# Patient Record
Sex: Male | Born: 1990 | Hispanic: No | Marital: Single | Smoking: Former smoker
Health system: Southern US, Community
[De-identification: ages and names within clinical notes are randomized; demographics above are authoritative.]

## PROBLEM LIST (undated history)

## (undated) HISTORY — PX: APPENDECTOMY: SHX54

---

## 2017-07-25 ENCOUNTER — Encounter (HOSPITAL_COMMUNITY): Payer: Self-pay | Admitting: Emergency Medicine

## 2017-07-25 ENCOUNTER — Ambulatory Visit (HOSPITAL_COMMUNITY)
Admission: EM | Admit: 2017-07-25 | Discharge: 2017-07-26 | Disposition: A | Discharge status: Home / Self Care | Payer: 59 | Attending: General Surgery | Admitting: General Surgery

## 2017-07-25 ENCOUNTER — Emergency Department (HOSPITAL_COMMUNITY): Payer: 59 | Admitting: Anesthesiology

## 2017-07-25 ENCOUNTER — Encounter (HOSPITAL_COMMUNITY)
Admission: EM | Disposition: A | Discharge status: Home / Self Care | Payer: Self-pay | Source: Home / Self Care | Attending: Emergency Medicine

## 2017-07-25 ENCOUNTER — Emergency Department (HOSPITAL_COMMUNITY): Payer: 59

## 2017-07-25 DIAGNOSIS — K3580 Unspecified acute appendicitis: Secondary | ICD-10-CM | POA: Diagnosis present

## 2017-07-25 DIAGNOSIS — K353 Acute appendicitis with localized peritonitis, without perforation or gangrene: Secondary | ICD-10-CM

## 2017-07-25 DIAGNOSIS — F17210 Nicotine dependence, cigarettes, uncomplicated: Secondary | ICD-10-CM | POA: Insufficient documentation

## 2017-07-25 HISTORY — PX: LAPAROSCOPIC APPENDECTOMY: SHX408

## 2017-07-25 LAB — CBC
HCT: 42.3 % (ref 39.0–52.0)
HEMATOCRIT: 44.5 % (ref 39.0–52.0)
HEMOGLOBIN: 15 g/dL (ref 13.0–17.0)
Hemoglobin: 14.3 g/dL (ref 13.0–17.0)
MCH: 27.9 pg (ref 26.0–34.0)
MCH: 28 pg (ref 26.0–34.0)
MCHC: 33.7 g/dL (ref 30.0–36.0)
MCHC: 33.8 g/dL (ref 30.0–36.0)
MCV: 82.7 fL (ref 78.0–100.0)
MCV: 82.8 fL (ref 78.0–100.0)
PLATELETS: 229 10*3/uL (ref 150–400)
Platelets: 233 10*3/uL (ref 150–400)
RBC: 5.38 MIL/uL (ref 4.22–5.81)
RBC: 5.11 MIL/uL (ref 4.22–5.81)
RDW: 12.5 % (ref 11.5–15.5)
RDW: 12.7 % (ref 11.5–15.5)
WBC: 7.6 10*3/uL (ref 4.0–10.5)
WBC: 7.7 10*3/uL (ref 4.0–10.5)

## 2017-07-25 LAB — COMPREHENSIVE METABOLIC PANEL
ALBUMIN: 4.3 g/dL (ref 3.5–5.0)
ALK PHOS: 69 U/L (ref 38–126)
ALT: 38 U/L (ref 17–63)
ANION GAP: 6 (ref 5–15)
AST: 25 U/L (ref 15–41)
BUN: 10 mg/dL (ref 6–20)
CO2: 28 mmol/L (ref 22–32)
Calcium: 9.3 mg/dL (ref 8.9–10.3)
Chloride: 104 mmol/L (ref 101–111)
Creatinine, Ser: 0.73 mg/dL (ref 0.61–1.24)
GFR calc Af Amer: >60 mL/min (ref >60)
GFR calc non Af Amer: >60 mL/min (ref >60)
GLUCOSE: 98 mg/dL (ref 65–99)
POTASSIUM: 3.8 mmol/L (ref 3.5–5.1)
SODIUM: 138 mmol/L (ref 135–145)
Total Bilirubin: 0.8 mg/dL (ref 0.3–1.2)
Total Protein: 6.8 g/dL (ref 6.5–8.1)

## 2017-07-25 LAB — URINALYSIS, ROUTINE W REFLEX MICROSCOPIC
BILIRUBIN URINE: NEGATIVE
GLUCOSE, UA: NEGATIVE mg/dL
HGB URINE DIPSTICK: NEGATIVE
Ketones, ur: NEGATIVE mg/dL
Leukocytes, UA: NEGATIVE
Nitrite: NEGATIVE
PH: 5 (ref 5.0–8.0)
PROTEIN: NEGATIVE mg/dL
SPECIFIC GRAVITY, URINE: 1.021 (ref 1.005–1.030)

## 2017-07-25 LAB — CREATININE, SERUM
CREATININE: 0.75 mg/dL (ref 0.61–1.24)
GFR calc Af Amer: >60 mL/min (ref >60)
GFR calc non Af Amer: >60 mL/min (ref >60)

## 2017-07-25 LAB — LIPASE, BLOOD: Lipase: 28 U/L (ref 11–51)

## 2017-07-25 SURGERY — APPENDECTOMY, LAPAROSCOPIC
Anesthesia: General | Site: Abdomen

## 2017-07-25 MED ORDER — FENTANYL CITRATE (PF) 100 MCG/2ML IJ SOLN
INTRAMUSCULAR | Status: DC | PRN
  Administered 2017-07-25: 50 ug via INTRAVENOUS
  Administered 2017-07-25 (×2): 100 ug via INTRAVENOUS

## 2017-07-25 MED ORDER — POTASSIUM CHLORIDE 2 MEQ/ML IV SOLN
INTRAVENOUS | Status: DC
  Administered 2017-07-25: 21:00:00 via INTRAVENOUS
  Filled 2017-07-25 (×4): qty 1000

## 2017-07-25 MED ORDER — BUPIVACAINE-EPINEPHRINE 0.25% -1:200000 IJ SOLN
INTRAMUSCULAR | Status: DC | PRN
  Administered 2017-07-25: 7 mL

## 2017-07-25 MED ORDER — DIPHENHYDRAMINE HCL 25 MG PO CAPS: 25.0000 mg | ORAL_CAPSULE | Freq: Four times a day (QID) | ORAL | Status: DC | PRN

## 2017-07-25 MED ORDER — ONDANSETRON 4 MG PO TBDP: 4.0000 mg | ORAL_TABLET | Freq: Four times a day (QID) | ORAL | Status: DC | PRN

## 2017-07-25 MED ORDER — PROPOFOL 10 MG/ML IV BOLUS
INTRAVENOUS | Status: DC | PRN
  Administered 2017-07-25: 200 mg via INTRAVENOUS

## 2017-07-25 MED ORDER — SUGAMMADEX SODIUM 200 MG/2ML IV SOLN
INTRAVENOUS | Status: DC | PRN
  Administered 2017-07-25: 200 mg via INTRAVENOUS

## 2017-07-25 MED ORDER — 0.9 % SODIUM CHLORIDE (POUR BTL) OPTIME
TOPICAL | Status: DC | PRN
  Administered 2017-07-25: 1000 mL

## 2017-07-25 MED ORDER — ENOXAPARIN SODIUM 40 MG/0.4ML ~~LOC~~ SOLN: 40.0000 mg | SUBCUTANEOUS | Status: DC

## 2017-07-25 MED ORDER — PROPOFOL 10 MG/ML IV BOLUS
INTRAVENOUS | Status: AC
  Filled 2017-07-25: qty 40

## 2017-07-25 MED ORDER — ACETAMINOPHEN 650 MG RE SUPP: 650.0000 mg | Freq: Four times a day (QID) | RECTAL | Status: DC | PRN

## 2017-07-25 MED ORDER — MIDAZOLAM HCL 2 MG/2ML IJ SOLN
INTRAMUSCULAR | Status: DC | PRN
  Administered 2017-07-25: 2 mg via INTRAVENOUS

## 2017-07-25 MED ORDER — BUPIVACAINE-EPINEPHRINE (PF) 0.25% -1:200000 IJ SOLN
INTRAMUSCULAR | Status: AC
Start: 2017-07-25 — End: 2017-07-25
  Filled 2017-07-25: qty 30

## 2017-07-25 MED ORDER — KETOROLAC TROMETHAMINE 30 MG/ML IJ SOLN: 30.0000 mg | Freq: Once | INTRAMUSCULAR | Status: DC | PRN

## 2017-07-25 MED ORDER — ACETAMINOPHEN 325 MG PO TABS: 650.0000 mg | ORAL_TABLET | Freq: Four times a day (QID) | ORAL | Status: DC | PRN

## 2017-07-25 MED ORDER — ONDANSETRON HCL 4 MG/2ML IJ SOLN
INTRAMUSCULAR | Status: AC
  Filled 2017-07-25: qty 2

## 2017-07-25 MED ORDER — PROMETHAZINE HCL 25 MG/ML IJ SOLN: 6.2500 mg | INTRAMUSCULAR | Status: DC | PRN

## 2017-07-25 MED ORDER — DEXAMETHASONE SODIUM PHOSPHATE 10 MG/ML IJ SOLN
INTRAMUSCULAR | Status: DC | PRN
  Administered 2017-07-25: 10 mg via INTRAVENOUS

## 2017-07-25 MED ORDER — DIPHENHYDRAMINE HCL 50 MG/ML IJ SOLN: 25.0000 mg | Freq: Four times a day (QID) | INTRAMUSCULAR | Status: DC | PRN

## 2017-07-25 MED ORDER — KETOROLAC TROMETHAMINE 30 MG/ML IJ SOLN
INTRAMUSCULAR | Status: DC | PRN
  Administered 2017-07-25: 30 mg via INTRAVENOUS

## 2017-07-25 MED ORDER — LIDOCAINE 2% (20 MG/ML) 5 ML SYRINGE
INTRAMUSCULAR | Status: AC
  Filled 2017-07-25: qty 5

## 2017-07-25 MED ORDER — ONDANSETRON HCL 4 MG/2ML IJ SOLN: 4.0000 mg | Freq: Four times a day (QID) | INTRAMUSCULAR | Status: DC | PRN

## 2017-07-25 MED ORDER — DEXTROSE 5 % IV SOLN
2.0000 g | INTRAVENOUS | Status: DC
  Filled 2017-07-25: qty 2

## 2017-07-25 MED ORDER — ONDANSETRON HCL 4 MG/2ML IJ SOLN
INTRAMUSCULAR | Status: DC | PRN
  Administered 2017-07-25: 4 mg via INTRAVENOUS

## 2017-07-25 MED ORDER — LACTATED RINGERS IV SOLN
INTRAVENOUS | Status: DC | PRN
  Administered 2017-07-25: 16:00:00 via INTRAVENOUS

## 2017-07-25 MED ORDER — HYDROCODONE-ACETAMINOPHEN 5-325 MG PO TABS
1.0000 | ORAL_TABLET | ORAL | Status: DC | PRN
  Administered 2017-07-26: 2 via ORAL
  Filled 2017-07-25: qty 2

## 2017-07-25 MED ORDER — METRONIDAZOLE IN NACL 5-0.79 MG/ML-% IV SOLN
500.0000 mg | Freq: Once | INTRAVENOUS | Status: AC
  Administered 2017-07-25: 500 mg via INTRAVENOUS
  Filled 2017-07-25: qty 100

## 2017-07-25 MED ORDER — HYDROMORPHONE HCL 1 MG/ML IJ SOLN
0.5000 mg | INTRAMUSCULAR | Status: DC | PRN
  Administered 2017-07-25: 1 mg via INTRAVENOUS
  Filled 2017-07-25: qty 1

## 2017-07-25 MED ORDER — ROCURONIUM BROMIDE 100 MG/10ML IV SOLN
INTRAVENOUS | Status: DC | PRN
  Administered 2017-07-25: 60 mg via INTRAVENOUS
  Administered 2017-07-25: 20 mg via INTRAVENOUS

## 2017-07-25 MED ORDER — HYDROMORPHONE HCL 1 MG/ML IJ SOLN
INTRAMUSCULAR | Status: AC
  Administered 2017-07-25: 0.5 mg via INTRAVENOUS
  Filled 2017-07-25: qty 1

## 2017-07-25 MED ORDER — ROCURONIUM BROMIDE 10 MG/ML (PF) SYRINGE
PREFILLED_SYRINGE | INTRAVENOUS | Status: AC
  Filled 2017-07-25: qty 5

## 2017-07-25 MED ORDER — METRONIDAZOLE IN NACL 5-0.79 MG/ML-% IV SOLN
500.0000 mg | Freq: Three times a day (TID) | INTRAVENOUS | Status: DC
  Administered 2017-07-26 (×2): 500 mg via INTRAVENOUS
  Filled 2017-07-25 (×3): qty 100

## 2017-07-25 MED ORDER — DEXAMETHASONE SODIUM PHOSPHATE 10 MG/ML IJ SOLN
INTRAMUSCULAR | Status: AC
  Filled 2017-07-25: qty 1

## 2017-07-25 MED ORDER — POTASSIUM CHLORIDE IN NACL 20-0.45 MEQ/L-% IV SOLN: INTRAVENOUS | Status: DC

## 2017-07-25 MED ORDER — FENTANYL CITRATE (PF) 250 MCG/5ML IJ SOLN
INTRAMUSCULAR | Status: AC
  Filled 2017-07-25: qty 5

## 2017-07-25 MED ORDER — LIDOCAINE 2% (20 MG/ML) 5 ML SYRINGE
INTRAMUSCULAR | Status: DC | PRN
  Administered 2017-07-25: 60 mg via INTRAVENOUS

## 2017-07-25 MED ORDER — MIDAZOLAM HCL 2 MG/2ML IJ SOLN
INTRAMUSCULAR | Status: AC
  Filled 2017-07-25: qty 2

## 2017-07-25 MED ORDER — SODIUM CHLORIDE 0.9 % IR SOLN
Status: DC | PRN
  Administered 2017-07-25: 1000 mL

## 2017-07-25 MED ORDER — DEXTROSE 5 % IV SOLN
2.0000 g | Freq: Once | INTRAVENOUS | Status: AC
  Administered 2017-07-25: 2 g via INTRAVENOUS
  Filled 2017-07-25: qty 2

## 2017-07-25 MED ORDER — KETOROLAC TROMETHAMINE 30 MG/ML IJ SOLN
INTRAMUSCULAR | Status: AC
  Filled 2017-07-25: qty 1

## 2017-07-25 MED ORDER — SUGAMMADEX SODIUM 200 MG/2ML IV SOLN
INTRAVENOUS | Status: AC
  Filled 2017-07-25: qty 2

## 2017-07-25 MED ORDER — HYDROMORPHONE HCL 1 MG/ML IJ SOLN
0.2500 mg | INTRAMUSCULAR | Status: DC | PRN
  Administered 2017-07-25 (×2): 0.5 mg via INTRAVENOUS

## 2017-07-25 SURGICAL SUPPLY — 34 items
APPLIER CLIP ROT 10 11.4 M/L (STAPLE)
BLADE CLIPPER SURG (BLADE) ×3 IMPLANT
CANISTER SUCT 3000ML PPV (MISCELLANEOUS) ×3 IMPLANT
CHLORAPREP W/TINT 26ML (MISCELLANEOUS) ×3 IMPLANT
CLIP APPLIE ROT 10 11.4 M/L (STAPLE) IMPLANT
COVER SURGICAL LIGHT HANDLE (MISCELLANEOUS) ×3 IMPLANT
CUTTER FLEX LINEAR 45M (STAPLE) ×3 IMPLANT
DERMABOND ADVANCED (GAUZE/BANDAGES/DRESSINGS) ×2
DERMABOND ADVANCED .7 DNX12 (GAUZE/BANDAGES/DRESSINGS) ×1 IMPLANT
ELECT REM PT RETURN 9FT ADLT (ELECTROSURGICAL) ×3
ELECTRODE REM PT RTRN 9FT ADLT (ELECTROSURGICAL) ×1 IMPLANT
GLOVE EUDERMIC 7 POWDERFREE (GLOVE) ×3 IMPLANT
GOWN STRL REUS W/ TWL LRG LVL3 (GOWN DISPOSABLE) ×2 IMPLANT
GOWN STRL REUS W/ TWL XL LVL3 (GOWN DISPOSABLE) ×1 IMPLANT
GOWN STRL REUS W/TWL LRG LVL3 (GOWN DISPOSABLE) ×4
GOWN STRL REUS W/TWL XL LVL3 (GOWN DISPOSABLE) ×2
KIT BASIN OR (CUSTOM PROCEDURE TRAY) ×3 IMPLANT
KIT ROOM TURNOVER OR (KITS) ×3 IMPLANT
NS IRRIG 1000ML POUR BTL (IV SOLUTION) ×3 IMPLANT
PAD ARMBOARD 7.5X6 YLW CONV (MISCELLANEOUS) ×6 IMPLANT
POUCH SPECIMEN RETRIEVAL 10MM (ENDOMECHANICALS) ×3 IMPLANT
RELOAD STAPLE TA45 3.5 REG BLU (ENDOMECHANICALS) ×3 IMPLANT
SET IRRIG TUBING LAPAROSCOPIC (IRRIGATION / IRRIGATOR) ×3 IMPLANT
SHEARS HARMONIC ACE PLUS 36CM (ENDOMECHANICALS) ×3 IMPLANT
SPECIMEN JAR SMALL (MISCELLANEOUS) ×3 IMPLANT
SUT MNCRL AB 4-0 PS2 18 (SUTURE) ×3 IMPLANT
TOWEL OR 17X24 6PK STRL BLUE (TOWEL DISPOSABLE) ×3 IMPLANT
TOWEL OR 17X26 10 PK STRL BLUE (TOWEL DISPOSABLE) ×3 IMPLANT
TRAY FOLEY CATH SILVER 16FR (SET/KITS/TRAYS/PACK) ×3 IMPLANT
TRAY LAPAROSCOPIC MC (CUSTOM PROCEDURE TRAY) ×3 IMPLANT
TROCAR XCEL 12X100 BLDLESS (ENDOMECHANICALS) ×3 IMPLANT
TROCAR XCEL BLUNT TIP 100MML (ENDOMECHANICALS) ×3 IMPLANT
TROCAR XCEL NON-BLD 5MMX100MML (ENDOMECHANICALS) ×3 IMPLANT
TUBING INSUFFLATION (TUBING) ×3 IMPLANT

## 2017-07-25 NOTE — ED Triage Notes (Signed)
Pt to ER for evaluation of RLQ abd pain that began suddenly and severely this morning at 4:30am, was seen at [facility] and sent here for appendicitis workup. Pt states diarrhea onset this morning as well. Pt is a/o x4.

## 2017-07-25 NOTE — H&P (Signed)
[facility] Admission Note  Chadd Tollison [facility] 03-Oct-1991  [id number].    Requesting MD: Regenia Skeeter, MD Chief Complaint/Reason for Consult: acute appendicitis  HPI:  Mr. Edward Melton is a 26 y.o. Otherwise healthy male with a cc of abdominal pain that started at 0430. Patient reports waking up at this time with generalized abdominal pain and diarrhea. Pain localized to RLQ and remained sharp, constant, and non-radiating so the patient presented to urgent care for evaluation before coming to the ED for a CT scan. He also endorses nausea. He denies similar pain in the past. Denies a history of abdominal surgery, GERD, GI issues, urinary symptoms, sick contacts, recent travel, or recent illness. He smokes 5 cigarettes daily and denies alcohol or illicit drug use. NKDA. Employed as a Barrister's clerk. Last drank water around 0730 this AM and has had no food today.  CT scan performed in ED significant for a dilated appendix (11m) and mild periappendiceal inflammation/edema without perforation or abscess. Lab work unremarkable.   ROS: Review of Systems  Gastrointestinal: Positive for abdominal pain, diarrhea and nausea. Negative for blood in stool.  All other systems reviewed and are negative.   History reviewed. No pertinent family history.  History reviewed. No pertinent past medical history.  History reviewed. No pertinent surgical history.  Social History:  reports that he has been smoking Cigarettes.  He has been smoking about 0.25 packs per day. He has never used smokeless tobacco. His alcohol and drug histories are not on file.  Allergies: Not on File   (Not in a hospital admission)  Blood pressure 110/82, pulse 72, temperature 98.9 F (37.2 C), temperature source Oral, resp. rate 16, SpO2 100 %. Physical Exam: Physical Exam  Constitutional: He is oriented to person, place, and time. He appears well-developed and well-nourished. No distress.  HENT:  Head: Normocephalic and  atraumatic.  Right Ear: External ear normal.  Left Ear: External ear normal.  Nose: Nose normal.  Eyes: Pupils are equal, round, and reactive to light. EOM are normal. Right eye exhibits no discharge. Left eye exhibits no discharge. No scleral icterus.  Neck: Normal range of motion. Neck supple. No tracheal deviation present. No thyromegaly present.  Cardiovascular: Normal rate, regular rhythm, normal heart sounds and intact distal pulses.   Pulmonary/Chest: Effort normal and breath sounds normal. No stridor. No respiratory distress. He has no wheezes. He has no rales.  Abdominal: Soft. Bowel sounds are normal. He exhibits no distension and no mass. There is tenderness. There is no rebound and no guarding. No hernia.  TTP RLQ without peritoneal signs.  Musculoskeletal: Normal range of motion. He exhibits no edema or deformity.  Neurological: He is alert and oriented to person, place, and time. No sensory deficit.  Skin: Skin is warm and dry. No rash noted. He is not diaphoretic.  Psychiatric: He has a normal mood and affect. His behavior is normal.    Results for orders placed or performed during the hospital encounter of 07/25/17 (from the past 48 hour(s))  Lipase, blood     Status: None   Collection Time: 07/25/17  9:44 AM  Result Value Ref Range   Lipase 28 11 - 51 U/L  Comprehensive metabolic panel     Status: None   Collection Time: 07/25/17  9:44 AM  Result Value Ref Range   Sodium 138 135 - 145 mmol/L   Potassium 3.8 3.5 - 5.1 mmol/L   Chloride 104 101 - 111 mmol/L   CO2 28 22 -  32 mmol/L   Glucose, Bld 98 65 - 99 mg/dL   BUN 10 6 - 20 mg/dL   Creatinine, Ser 0.73 0.61 - 1.24 mg/dL   Calcium 9.3 8.9 - 10.3 mg/dL   Total Protein 6.8 6.5 - 8.1 g/dL   Albumin 4.3 3.5 - 5.0 g/dL   AST 25 15 - 41 U/L   ALT 38 17 - 63 U/L   Alkaline Phosphatase 69 38 - 126 U/L   Total Bilirubin 0.8 0.3 - 1.2 mg/dL   GFR calc non Af Amer >60 >60 mL/min   GFR calc Af Amer >60 >60 mL/min     Comment: (NOTE) The eGFR has been calculated using the CKD EPI equation. This calculation has not been validated in all clinical situations. eGFR's persistently <60 mL/min signify possible Chronic Kidney Disease.    Anion gap 6 5 - 15  CBC     Status: None   Collection Time: 07/25/17  9:44 AM  Result Value Ref Range   WBC 7.6 4.0 - 10.5 K/uL   RBC 5.38 4.22 - 5.81 MIL/uL   Hemoglobin 15.0 13.0 - 17.0 g/dL   HCT 44.5 39.0 - 52.0 %   MCV 82.7 78.0 - 100.0 fL   MCH 27.9 26.0 - 34.0 pg   MCHC 33.7 30.0 - 36.0 g/dL   RDW 12.5 11.5 - 15.5 %   Platelets 233 150 - 400 K/uL  Urinalysis, Routine w reflex microscopic     Status: None   Collection Time: 07/25/17  9:58 AM  Result Value Ref Range   Color, Urine YELLOW YELLOW   APPearance CLEAR CLEAR   Specific Gravity, Urine 1.021 1.005 - 1.030   pH 5.0 5.0 - 8.0   Glucose, UA NEGATIVE NEGATIVE mg/dL   Hgb urine dipstick NEGATIVE NEGATIVE   Bilirubin Urine NEGATIVE NEGATIVE   Ketones, ur NEGATIVE NEGATIVE mg/dL   Protein, ur NEGATIVE NEGATIVE mg/dL   Nitrite NEGATIVE NEGATIVE   Leukocytes, UA NEGATIVE NEGATIVE   Ct Renal Stone Study  Result Date: 07/25/2017 CLINICAL DATA:  Right-sided abdominal pain and nausea. EXAM: CT ABDOMEN AND PELVIS WITHOUT CONTRAST TECHNIQUE: Multidetector CT imaging of the abdomen and pelvis was performed following the standard protocol without IV contrast. COMPARISON:  None. FINDINGS: Lower chest:  Unremarkable. Hepatobiliary: The liver shows diffusely decreased attenuation suggesting steatosis. There is no evidence for gallstones, gallbladder wall thickening, or pericholecystic fluid. No intrahepatic or extrahepatic biliary dilation. Pancreas: No focal mass lesion. No dilatation of the main duct. No intraparenchymal cyst. No peripancreatic edema. Spleen: No splenomegaly. No focal mass lesion. Adrenals/Urinary Tract: No adrenal nodule or mass. No stones in either kidney or ureter. No secondary changes in either kidney  or ureter. The urinary bladder appears normal for the degree of distention. Stomach/Bowel: The heart size is normal. Stomach is nondistended. No gastric wall thickening. No evidence of outlet obstruction. Duodenum is normally positioned as is the ligament of Treitz. No small bowel wall thickening. No small bowel dilatation. The terminal ileum is normal. The appendix is dilated up to 8 mm diameter and there is periappendiceal edema/ inflammation. No gross colonic mass. No colonic wall thickening. No substantial diverticular change. Vascular/Lymphatic: No abdominal aortic aneurysm. There is no gastrohepatic or hepatoduodenal ligament lymphadenopathy. No intraperitoneal or retroperitoneal lymphadenopathy. No pelvic sidewall lymphadenopathy. Reproductive: The prostate gland and seminal vesicles have normal imaging features. Other: Trace free fluid is evident in the pelvis. Musculoskeletal: Bone windows reveal no worrisome lytic or sclerotic osseous lesions. IMPRESSION: 1. Mild  appendiceal dilatation with mild periappendiceal edema/inflammation. Imaging features are consistent with acute appendicitis. No evidence for perforation or abscess by CT. I called these findings directly to Dr. Regenia Skeeter at 1433 hours on 07/25/2017. Electronically Signed   By: Misty Stanley M.D.   On: 07/25/2017 14:33   Assessment/Plan Acute appendicitis   Admit to [facility] service  NPO, IVF  IV Rocephin/flagyl  OR today for laparoscopic appendectomy  PRN analgesics and antiemetics    Jill Alexanders, [facility] Surgery 07/25/2017, 3:12 PM Pager: [phone] Consults: [phone] Mon-Fri 7:00 am-4:30 pm Sat-Sun 7:00 am-11:30 am

## 2017-07-25 NOTE — Anesthesia Procedure Notes (Addendum)
Procedure Name: Intubation Date/Time: 07/25/2017 4:15 PM Performed by: Trixie Deis A Pre-anesthesia Checklist: Patient identified, Emergency Drugs available, Suction available and Patient being monitored Patient Re-evaluated:Patient Re-evaluated prior to induction Oxygen Delivery Method: Circle System Utilized Preoxygenation: Pre-oxygenation with 100% oxygen Induction Type: IV induction Ventilation: Mask ventilation without difficulty Laryngoscope Size: Mac and 4 Grade View: Grade I Tube type: Oral Tube size: 7.5 mm Number of attempts: 1 Airway Equipment and Method: Stylet and Oral airway Placement Confirmation: ETT inserted through vocal cords under direct vision,  positive ETCO2 and breath sounds checked- equal and bilateral Secured at: 23 cm Tube secured with: Tape Dental Injury: Teeth and Oropharynx as per pre-operative assessment

## 2017-07-25 NOTE — Transfer of Care (Signed)
Immediate Anesthesia Transfer of Care Note  Patient: Edward Melton  Procedure(s) Performed: Procedure(s): APPENDECTOMY LAPAROSCOPIC (N/A)  Patient Location: PACU  Anesthesia Type:General  Level of Consciousness: awake, alert  and oriented  Airway & Oxygen Therapy: Patient Spontanous Breathing and Patient connected to nasal cannula oxygen  Post-op Assessment: Report given to RN, Post -op Vital signs reviewed and stable and Patient moving all extremities  Post vital signs: Reviewed and stable  Last Vitals:  Vitals:   07/25/17 1204 07/25/17 1723  BP: 110/82 (P) 119/80  Pulse: 72 (P) 76  Resp: 16 (P) 10  Temp: 37.2 C 36.9 C  SpO2: 100% (P) 100%    Last Pain:  Vitals:   07/25/17 1204  TempSrc: Oral  PainSc:          Complications: No apparent anesthesia complications

## 2017-07-25 NOTE — Op Note (Signed)
Patient Name:           Edward Melton   Date of Surgery:        07/25/2017  Pre op Diagnosis:      Acute appendicitis  Post op Diagnosis:    Acute suppurative appendicitis  Procedure:                 Laparoscopic appendectomy  Surgeon:                     Angelia Mould. Derrell Lolling, M.D., FACS  Assistant:                      OR staff   Indication for Assistant: N/A  Operative Indications:   This is a 26 year old man who states he has had abdominal pain since 4:30 this morning, a proximally 12 hours in duration.  No prior similar history.  He had some diarrhea.  The pain localized to the right lower quadrant.  He was nauseated.  No prior abdominal surgery.  Lab work and urinalysis is normal but his CT scan shows acute appendicitis with thickening and inflammatory changes around the appendix.  There is no abscess or evidence of perforation.  He was given IV fluids, IV antibiotics and brought to the operating room following counseling  Operative Findings:       The appendix was acutely inflamed, thickened, and had exudate on it.  There was no evidence of gangrene or perforation.  Terminal ileum and cecum, liver, rest of the colon viscera look normal.  Procedure in Detail:          Following the induction of general endotracheal anesthesia a Foley catheter was inserted, the abdomen was prepped and draped in a sterile fashion, surgical timeout was performed.  He was up-to-date on his antibiotics.  0.5% Marcaine with epinephrine was used as a local infiltration anesthetic.     A vertical incision was made at the upper rim of the umbilicus.  The fascia was incised in the midline and the abdominal cavity entered under direct vision.  An 11 mm Hassan trocar was inserted and secured with the Purstring suture of 0 Vicryl.  Pneumoperitoneum was created.  Video camera was inserted.  A 5 mm trocar was placed in the midline below the umbilicus and a 12 mm trocar placed in the suprapubic area under direct vision.   Four-quadrant inspection revealed no bleeding or intestinal injury.  After positioning the patient we were able to identify an  inflamed appendix, terminal ileum, ileocecal valve and cecum.  Using the harmonic scalpel I divided some of the peritoneal attachments to rotate the cecum medially.  I then divided the appendiceal mesentery and appendiceal artery with the harmonic scalpel with excellent hemostasis.  An Endo GIA was with a blue 2.5 mm load was placed across the appendix at the base of the cecum.  The stapler was closed, held in place for 20 seconds, fired,  and removed.  The appendix was  placed in a specimen bag and removed.  A couple of loose staples were removed.  The staple line was inspected.  Looked very good and there was no bleeding.  We irrigated the area.  We inspected the area of dissection there is no sign of any injury to adjacent organs.     The trochars were removed under direct vision.  The pneumoperitoneum was released.  The fascia at the umbilicus was closed with 0 Vicryl sutures.  After irrigating the trocar wounds were closed the skin with subcuticular 4-0 Monocryl and Dermabond.  The patient tolerated procedure well was taken to PACU in stable condition.  EBL 15-20 mL.  Counts correct.  Complications none.     Angelia Mould. Derrell Lolling, M.D., FACS General and Minimally Invasive Surgery Breast and Colorectal Surgery  07/25/2017 5:11 PM

## 2017-07-25 NOTE — Anesthesia Preprocedure Evaluation (Signed)
Anesthesia Evaluation  Patient identified by MRN, date of birth, ID band Patient awake    Reviewed: Allergy & Precautions, NPO status , Patient's Chart, lab work & pertinent test results  Airway Mallampati: II  TM Distance: >3 FB Neck ROM: Full    Dental no notable dental hx.    Pulmonary Current Smoker,    Pulmonary exam normal breath sounds clear to auscultation       Cardiovascular negative cardio ROS Normal cardiovascular exam Rhythm:Regular Rate:Normal     Neuro/Psych negative neurological ROS  negative psych ROS   GI/Hepatic negative GI ROS, Neg liver ROS,   Endo/Other  negative endocrine ROS  Renal/GU negative Renal ROS  negative genitourinary   Musculoskeletal negative musculoskeletal ROS (+)   Abdominal   Peds negative pediatric ROS (+)  Hematology negative hematology ROS (+)   Anesthesia Other Findings   Reproductive/Obstetrics negative OB ROS                             Anesthesia Physical Anesthesia Plan  ASA: II  Anesthesia Plan: General   Post-op Pain Management:    Induction: Intravenous  PONV Risk Score and Plan: 1 and Ondansetron and Dexamethasone  Airway Management Planned: Oral ETT  Additional Equipment:   Intra-op Plan:   Post-operative Plan: Extubation in OR  Informed Consent: I have reviewed the patients History and Physical, chart, labs and discussed the procedure including the risks, benefits and alternatives for the proposed anesthesia with the patient or authorized representative who has indicated his/her understanding and acceptance.   Dental advisory given  Plan Discussed with: CRNA and Surgeon  Anesthesia Plan Comments:         Anesthesia Quick Evaluation

## 2017-07-25 NOTE — Anesthesia Postprocedure Evaluation (Signed)
Anesthesia Post Note  Patient: Edward Melton  Procedure(s) Performed: Procedure(s) (LRB): APPENDECTOMY LAPAROSCOPIC (N/A)     Patient location during evaluation: PACU Anesthesia Type: General Level of consciousness: awake and alert Pain management: pain level controlled Vital Signs Assessment: post-procedure vital signs reviewed and stable Respiratory status: spontaneous breathing, nonlabored ventilation, respiratory function stable and patient connected to nasal cannula oxygen Cardiovascular status: blood pressure returned to baseline and stable Postop Assessment: no apparent nausea or vomiting Anesthetic complications: no    Last Vitals:  Vitals:   07/25/17 1723 07/25/17 1730  BP: 119/80 113/75  Pulse: 76 69  Resp: 10 (!) 23  Temp: 36.9 C   SpO2: 100% 99%    Last Pain:  Vitals:   07/25/17 1723  TempSrc:   PainSc: 0-No pain                 Ernestyne Caldwell S

## 2017-07-25 NOTE — Interval H&P Note (Signed)
History and Physical Interval Note:  07/25/2017 3:31 PM  Edward Melton Edward Melton  has presented today for surgery, with the diagnosis of APPENDICITIS  The various methods of treatment have been discussed with the patient and family. After consideration of risks, benefits and other options for treatment, the patient has consented to  Procedure(s): APPENDECTOMY LAPAROSCOPIC (N/A) as a surgical intervention .  The patient's history has been reviewed, patient examined, no change in status, stable for surgery.  I have reviewed the patient's chart and labs.  Questions were answered to the patient's satisfaction.     Ernestene Mention

## 2017-07-25 NOTE — ED Provider Notes (Signed)
[facility]-EMERGENCY DEPT Provider Note   CSN: [id number] Arrival date & time: 07/25/17  4098     History   Chief Complaint Chief Complaint  Patient presents with  . Abdominal Cramping    HPI Arlington Sigmund is a 26 y.o. male.  HPI  26 year old male presents with right lower quadrant abdominal pain. He states that this morning at 4:30 AM he awoke suddenly with diffuse abdominal pain. He states that he immediately had to go the bathroom and had diarrhea. He's had 4 or 5 episodes of watery diarrhea. For a couple hours the pain was generalized but now seems to be only in his right lower abdomen. He went to urgent care was told to come here to rule out appendicitis. He has not had any fevers but has had a little bit of nausea without vomiting. The pain initially was about 8/10 but now is about a 5/10. No urinary symptoms. A little bit of low back pain. He also endorses that he helped his brother move yesterday but does not remember injuring himself while lifting. He also had a milkshake last night around 8 PM and states he felt a little funny for about 30 minute afterwards but no other specific complaints. Pain worsens some with movement.  History reviewed. No pertinent past medical history.  There are no active problems to display for this patient.   History reviewed. No pertinent surgical history.     Home Medications    Prior to Admission medications   Not on File    Family History History reviewed. No pertinent family history.  Social History Social History  Substance Use Topics  . Smoking status: Current Every Day Smoker    Packs/day: 0.25    Types: Cigarettes  . Smokeless tobacco: Never Used  . Alcohol use Not on file     Allergies   Patient has no allergy information on record.   Review of Systems Review of Systems  Constitutional: Negative for fever.  Gastrointestinal: Positive for abdominal pain, diarrhea and nausea. Negative for blood in stool and  vomiting.  Genitourinary: Negative for dysuria and hematuria.  Musculoskeletal: Positive for back pain.  All other systems reviewed and are negative.    Physical Exam Updated Vital Signs BP 110/82 (BP Location: Left Arm)   Pulse 72   Temp 98.9 F (37.2 C) (Oral)   Resp 16   SpO2 100%   Physical Exam  Constitutional: He is oriented to person, place, and time. He appears well-developed and well-nourished.  HENT:  Head: Normocephalic and atraumatic.  Right Ear: External ear normal.  Left Ear: External ear normal.  Nose: Nose normal.  Eyes: Right eye exhibits no discharge. Left eye exhibits no discharge.  Neck: Neck supple.  Cardiovascular: Normal rate, regular rhythm and normal heart sounds.   Pulmonary/Chest: Effort normal and breath sounds normal.  Abdominal: Soft. There is tenderness in the right lower quadrant.  Genitourinary: Testes normal and penis normal. Right testis shows no swelling and no tenderness. Left testis shows no swelling and no tenderness. Circumcised.  Musculoskeletal: He exhibits no edema.  Neurological: He is alert and oriented to person, place, and time.  Skin: Skin is warm and dry.  Nursing note and vitals reviewed.    ED Treatments / Results  Labs (all labs ordered are listed, but only abnormal results are displayed) Labs Reviewed  LIPASE, BLOOD  COMPREHENSIVE METABOLIC PANEL  CBC  URINALYSIS, ROUTINE W REFLEX MICROSCOPIC    EKG  EKG Interpretation None  Radiology Ct Renal Stone Study  Result Date: 07/25/2017 CLINICAL DATA:  Right-sided abdominal pain and nausea. EXAM: CT ABDOMEN AND PELVIS WITHOUT CONTRAST TECHNIQUE: Multidetector CT imaging of the abdomen and pelvis was performed following the standard protocol without IV contrast. COMPARISON:  None. FINDINGS: Lower chest:  Unremarkable. Hepatobiliary: The liver shows diffusely decreased attenuation suggesting steatosis. There is no evidence for gallstones, gallbladder wall  thickening, or pericholecystic fluid. No intrahepatic or extrahepatic biliary dilation. Pancreas: No focal mass lesion. No dilatation of the main duct. No intraparenchymal cyst. No peripancreatic edema. Spleen: No splenomegaly. No focal mass lesion. Adrenals/Urinary Tract: No adrenal nodule or mass. No stones in either kidney or ureter. No secondary changes in either kidney or ureter. The urinary bladder appears normal for the degree of distention. Stomach/Bowel: The heart size is normal. Stomach is nondistended. No gastric wall thickening. No evidence of outlet obstruction. Duodenum is normally positioned as is the ligament of Treitz. No small bowel wall thickening. No small bowel dilatation. The terminal ileum is normal. The appendix is dilated up to 8 mm diameter and there is periappendiceal edema/ inflammation. No gross colonic mass. No colonic wall thickening. No substantial diverticular change. Vascular/Lymphatic: No abdominal aortic aneurysm. There is no gastrohepatic or hepatoduodenal ligament lymphadenopathy. No intraperitoneal or retroperitoneal lymphadenopathy. No pelvic sidewall lymphadenopathy. Reproductive: The prostate gland and seminal vesicles have normal imaging features. Other: Trace free fluid is evident in the pelvis. Musculoskeletal: Bone windows reveal no worrisome lytic or sclerotic osseous lesions. IMPRESSION: 1. Mild appendiceal dilatation with mild periappendiceal edema/inflammation. Imaging features are consistent with acute appendicitis. No evidence for perforation or abscess by CT. I called these findings directly to Dr. Criss Alvine at 1433 hours on 07/25/2017. Electronically Signed   By: Kennith Center M.D.   On: 07/25/2017 14:33    Procedures Procedures (including critical care time)  Medications Ordered in ED Medications  cefTRIAXone (ROCEPHIN) 2 g in dextrose 5 % 50 mL IVPB (2 g Intravenous New Bag/Given 07/25/17 1500)    And  metroNIDAZOLE (FLAGYL) IVPB 500 mg (500 mg  Intravenous New Bag/Given 07/25/17 1500)     Initial Impression / Assessment and Plan / ED Course  I have reviewed the triage vital signs and the nursing notes.  Pertinent labs & imaging results that were available during my care of the patient were reviewed by me and considered in my medical decision making (see chart for details).     Patient's presentation is atypical but CT shows early appendicitis. He will be kept nothing by mouth, IV antibiotics will be given. He has declined both oral and IV pain medicine. General surgery consulted, will admit and take to OR  Final Clinical Impressions(s) / ED Diagnoses   Final diagnoses:  Acute appendicitis with localized peritonitis    New Prescriptions New Prescriptions   No medications on file     Pricilla Loveless, MD 07/25/17 1512

## 2017-07-26 ENCOUNTER — Encounter (HOSPITAL_COMMUNITY): Payer: Self-pay | Admitting: General Surgery

## 2017-07-26 LAB — HIV ANTIBODY (ROUTINE TESTING W REFLEX): HIV SCREEN 4TH GENERATION: NONREACTIVE

## 2017-07-26 MED ORDER — HYDROCODONE-ACETAMINOPHEN 5-325 MG PO TABS: 1.0000 | ORAL_TABLET | ORAL | 0 refills | Status: AC | PRN

## 2017-07-26 NOTE — Discharge Summary (Signed)
Patient ID: Edward Melton [id number] 26 y.o. 1991-09-06  Admit date: 07/25/2017  Discharge date and time: 07/26/2017  Admitting Physician: Ernestene Mention  Discharge Physician: Ernestene Mention  Admission Diagnoses: Acute appendicitis with localized peritonitis [K35.3]  Discharge Diagnoses: same  Operations: Procedure(s): APPENDECTOMY LAPAROSCOPIC  Admission Condition: fair  Discharged Condition: good  Indication for Admission: This is a 26 year old man who states he has had abdominal pain since 4:30 this morning, a proximally 12 hours in duration.  No prior similar history.  He had some diarrhea.  The pain localized to the right lower quadrant.  He was nauseated.  No prior abdominal surgery.  Lab work and urinalysis is normal but his CT scan shows acute appendicitis with thickening and inflammatory changes around the appendix.  There is no abscess or evidence of perforation.  He was given IV fluids, IV antibiotics and brought to the operating room following counseling  Hospital Course: on the day of admission he was taken to the operating room and underwent laparoscopic appendectomy.  The appendix was found to be acutely inflamed with exudate on the appendix but no abscess, gangrene or perforation.  He was observed overnight and did very well.  He progressed in his diet and activities without difficulty.  He felt much better the morning after surgery.  He was tolerating a regular diet, and relating and voiding uneventfully.    He was discharged  He was given instructions and a prescription for Norco for pain.  Diet and activities were discussed.  He was asked to call our office and set up an appointment to see [place] in 3 weeks.     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  Consults: None  Significant Diagnostic Studies: CT scan.  Surgical pathology.  Treatments: laparoscopic  appendectomy  Disposition: Home  Patient Instructions:  Allergies as of 07/26/2017   Not on File     Medication List    TAKE these medications   HYDROcodone-acetaminophen 5-325 MG tablet Commonly known as:  NORCO/VICODIN Take 1-2 tablets by mouth every 4 (four) hours as needed for moderate pain.            Discharge Care Instructions        Start     Ordered   07/26/17 0000  HYDROcodone-acetaminophen (NORCO/VICODIN) 5-325 MG tablet  Every 4 hours PRN     07/26/17 0621   07/26/17 0000  Increase activity slowly     07/26/17 0621   07/26/17 0000  Diet - low sodium heart healthy     07/26/17 4696   07/26/17 0000  Discharge instructions    Comments:  [facility] LAPAROSCOPIC SURGERY: POST OP INSTRUCTIONS Always review your discharge instruction sheet given to you by the facility where your surgery was performed. IF YOU HAVE DISABILITY OR FAMILY LEAVE FORMS, YOU MUST BRING THEM TO THE OFFICE FOR PROCESSING.   DO NOT GIVE THEM TO YOUR DOCTOR.  A prescription for pain medication may be given to you upon discharge.  Take your pain medication as prescribed, if needed.  If narcotic pain medicine is not needed, then you may take acetaminophen (Tylenol) or ibuprofen (Advil) as needed. Take your usually prescribed medications unless otherwise directed. If you need a refill on your pain medication, please contact your pharmacy.  They will contact our office to request authorization. Prescriptions will not be filled  after 5pm or on week-ends. You should follow a light diet the first few days after arrival home, such as soup and crackers, etc.  Be sure to include lots of fluids daily. Most patients will experience some swelling and bruising in the area of the incisions.  Ice packs will help.  Swelling and bruising can take several days to resolve.  It is common to experience some constipation if taking pain medication after surgery.  Increasing fluid intake and  taking a stool softener (such as Colace) will usually help or prevent this problem from occurring.  A mild laxative (Milk of Magnesia or Miralax) should be taken according to package instructions if there are no bowel movements after 48 hours. Unless discharge instructions indicate otherwise, you may remove your bandages 24-48 hours after surgery, and you may shower at that time.  You may have steri-strips (small skin tapes) in place directly over the incision.  These strips should be left on the skin for 7-10 days.  If your surgeon used skin glue on the incision, you may shower in 24 hours.  The glue will flake off over the next 2-3 weeks.  Any sutures or staples will be removed at the office during your follow-up visit. ACTIVITIES:  You may resume regular (light) daily activities beginning the next day-such as daily self-care, walking, climbing stairs-gradually increasing activities as tolerated.  You may have sexual intercourse when it is comfortable.  Refrain from any heavy lifting or straining until approved by your doctor. You may drive when you are no longer taking prescription pain medication, you can comfortably wear a seatbelt, and you can safely maneuver your car and apply brakes. RETURN TO WORK:  __________________________________________________________ [name] should see your doctor in the office for a follow-up appointment approximately 2-3 weeks after your surgery.  Make sure that you call for this appointment within a day or two after you arrive home to insure a convenient appointment time. OTHER INSTRUCTIONS: __________________________________________________________________________________________________________________________ __________________________________________________________________________________________________________________________ WHEN TO CALL YOUR DOCTOR: Fever over 101.0 Inability to urinate Continued bleeding from incision. Increased pain, redness, or drainage from the  incision. Increasing abdominal pain  The clinic staff is available to answer your questions during regular business hours.  Please don't hesitate to call and ask to speak to one of the nurses for clinical concerns.  If you have a medical emergency, go to the nearest emergency room or call 911.  A surgeon from [facility] is always on call at the hospital. [address], [address], [place], [place]  [address] ? [address] [address], [place], [place]   [address] [phone] ? [phone] ? FAX [phone] Web site: [email or web address]   07/26/17 0272   07/26/17 0000  Driving Restrictions    Comments:  2-4 days when off narcotics   07/26/17 0621   07/26/17 0000  Lifting restrictions    Comments:  20 lbs for 3-4 weeks   07/26/17 5366   07/26/17 0000  Other Restrictions    Comments:  Return to work in 1-2 weeks   07/26/17 4403   07/26/17 0000  Call MD for:  persistant dizziness or light-headedness     07/26/17 0621   07/26/17 0000  Call MD for:  hives     07/26/17 0621   07/26/17 0000  Call MD for:  difficulty breathing, headache or visual disturbances     07/26/17 0621   07/26/17 0000  Call MD for:  redness, tenderness, or signs of infection (pain, swelling, redness, odor or  green/yellow discharge around incision site)     07/26/17 0621   07/26/17 0000  Call MD for:  severe uncontrolled pain     07/26/17 0621   07/26/17 0000  Call MD for:  persistant nausea and vomiting     07/26/17 0621   07/26/17 0000  Call MD for:  temperature >100.4     07/26/17 0621   07/26/17 0000  No wound care     07/26/17 0621      Activity: no sports or heavy lifting for 3-4 weeks.  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  Signed: Angelia Mould. Derrell Lolling, M.D., FACS General and minimally invasive surgery Breast and Colorectal Surgery  I logged onto the [email or web address] and  reviewed his prescription medication history   07/26/2017, 6:23 AM

## 2017-07-26 NOTE — Discharge Instructions (Signed)
[facility] ______[facility] °LAPAROSCOPIC SURGERY: POST OP INSTRUCTIONS °Always review your discharge instruction sheet given to you by the facility where your surgery was performed. °IF YOU HAVE DISABILITY OR FAMILY LEAVE FORMS, YOU MUST BRING THEM TO THE OFFICE FOR PROCESSING.   °DO NOT GIVE THEM TO YOUR DOCTOR. ° °1. A prescription for pain medication may be given to you upon discharge.  Take your pain medication as prescribed, if needed.  If narcotic pain medicine is not needed, then you may take acetaminophen (Tylenol) or ibuprofen (Advil) as needed. °2. Take your usually prescribed medications unless otherwise directed. °3. If you need a refill on your pain medication, please contact your pharmacy.  They will contact our office to request authorization. Prescriptions will not be filled after 5pm or on week-ends. °4. You should follow a light diet the first few days after arrival home, such as soup and crackers, etc.  Be sure to include lots of fluids daily. °5. Most patients will experience some swelling and bruising in the area of the incisions.  Ice packs will help.  Swelling and bruising can take several days to resolve.  °6. It is common to experience some constipation if taking pain medication after surgery.  Increasing fluid intake and taking a stool softener (such as Colace) will usually help or prevent this problem from occurring.  A mild laxative (Milk of Magnesia or Miralax) should be taken according to package instructions if there are no bowel movements after 48 hours. °7. Unless discharge instructions indicate otherwise, you may remove your bandages 24-48 hours after surgery, and you may shower at that time.  You may have steri-strips (small skin tapes) in place directly over the incision.  These strips should be left on the skin for 7-10 days.  If your surgeon used skin glue on the incision, you may shower in 24 hours.  The glue will flake off over the next 2-3 weeks.  Any sutures or  staples will be removed at the office during your follow-up visit. °8. ACTIVITIES:  You may resume regular (light) daily activities beginning the next day--such as daily self-care, walking, climbing stairs--gradually increasing activities as tolerated.  You may have sexual intercourse when it is comfortable.  Refrain from any heavy lifting or straining until approved by your doctor. °a. You may drive when you are no longer taking prescription pain medication, you can comfortably wear a seatbelt, and you can safely maneuver your car and apply brakes. °b. RETURN TO WORK:  __________________________________________________________ °9. You should see your doctor in the office for a follow-up appointment approximately 2-3 weeks after your surgery.  Make sure that you call for this appointment within a day or two after you arrive home to insure a convenient appointment time. °10. OTHER INSTRUCTIONS: __________________________________________________________________________________________________________________________ __________________________________________________________________________________________________________________________ °WHEN TO CALL YOUR DOCTOR: °1. Fever over 101.0 °2. Inability to urinate °3. Continued bleeding from incision. °4. Increased pain, redness, or drainage from the incision. °5. Increasing abdominal pain ° °The clinic staff is available to answer your questions during regular business hours.  Please don’t hesitate to call and ask to speak to one of the nurses for clinical concerns.  If you have a medical emergency, go to the nearest emergency room or call 911.  A surgeon from [facility] is always on call at the hospital. °[address], [address], [place], [place]  [address] ? [address] [address], [place], [place]   [address] °[phone] ? [phone] ? FAX [phone] °Web site:   [email or web address] °

## 2017-07-26 NOTE — Progress Notes (Signed)
Discussed discharge summary with patient. Reviewed all medications with patient. Patient received prescription, work note, and school note. Patient ready for discharge.

## 2018-09-27 ENCOUNTER — Encounter (HOSPITAL_COMMUNITY): Payer: Self-pay | Admitting: Emergency Medicine

## 2018-09-27 ENCOUNTER — Emergency Department (HOSPITAL_COMMUNITY)
Admission: EM | Admit: 2018-09-27 | Discharge: 2018-09-27 | Disposition: A | Discharge status: Home / Self Care | Payer: Self-pay | Attending: Emergency Medicine | Admitting: Emergency Medicine

## 2018-09-27 ENCOUNTER — Emergency Department (HOSPITAL_COMMUNITY): Payer: Self-pay

## 2018-09-27 DIAGNOSIS — K5900 Constipation, unspecified: Secondary | ICD-10-CM

## 2018-09-27 DIAGNOSIS — K5641 Fecal impaction: Secondary | ICD-10-CM | POA: Insufficient documentation

## 2018-09-27 DIAGNOSIS — Z87891 Personal history of nicotine dependence: Secondary | ICD-10-CM | POA: Insufficient documentation

## 2018-09-27 LAB — CBC WITH DIFFERENTIAL/PLATELET
ABS IMMATURE GRANULOCYTES: 0.02 10*3/uL (ref 0.00–0.07)
BASOS PCT: 0 %
Basophils Absolute: 0 10*3/uL (ref 0.0–0.1)
Eosinophils Absolute: 0 10*3/uL (ref 0.0–0.5)
Eosinophils Relative: 0 %
HCT: 45.5 % (ref 39.0–52.0)
Hemoglobin: 14.8 g/dL (ref 13.0–17.0)
IMMATURE GRANULOCYTES: 0 %
LYMPHS PCT: 20 %
Lymphs Abs: 1.6 10*3/uL (ref 0.7–4.0)
MCH: 26.7 pg (ref 26.0–34.0)
MCHC: 32.5 g/dL (ref 30.0–36.0)
MCV: 82.1 fL (ref 80.0–100.0)
MONOS PCT: 7 %
Monocytes Absolute: 0.6 10*3/uL (ref 0.1–1.0)
NEUTROS ABS: 5.8 10*3/uL (ref 1.7–7.7)
NEUTROS PCT: 73 %
Platelets: 355 10*3/uL (ref 150–400)
RBC: 5.54 MIL/uL (ref 4.22–5.81)
RDW: 11.7 % (ref 11.5–15.5)
WBC: 8 10*3/uL (ref 4.0–10.5)
nRBC: 0 % (ref 0.0–0.2)

## 2018-09-27 LAB — BASIC METABOLIC PANEL
ANION GAP: 8 (ref 5–15)
BUN: 12 mg/dL (ref 6–20)
CO2: 27 mmol/L (ref 22–32)
Calcium: 9.3 mg/dL (ref 8.9–10.3)
Chloride: 102 mmol/L (ref 98–111)
Creatinine, Ser: 0.81 mg/dL (ref 0.61–1.24)
GFR calc Af Amer: >60 mL/min (ref >60)
GFR calc non Af Amer: >60 mL/min (ref >60)
Glucose, Bld: 111 mg/dL — ABNORMAL HIGH (ref 70–99)
Potassium: 4.3 mmol/L (ref 3.5–5.1)
SODIUM: 137 mmol/L (ref 135–145)

## 2018-09-27 MED ORDER — MILK AND MOLASSES ENEMA
1.0000 | Freq: Once | RECTAL | Status: AC
  Administered 2018-09-27: 250 mL via RECTAL
  Filled 2018-09-27: qty 250

## 2018-09-27 NOTE — Discharge Instructions (Signed)
We saw you in the ER for the rectal pain. It appears that you had colonic fecal impaction.  You were given an enema, which hopefully will result in disimpaction and a bowel movement. We recommend that you do not strain yourself and having a bowel movement.  Lease drink plenty of fluid and take over-the-counter MiraLAX and, increase fiber in your diet.  Return to the ER if your pain gets worse or if you stop passing gas.

## 2018-09-27 NOTE — ED Triage Notes (Signed)
Reports being constipated and took a laxative earlier today.  Tried to have a bm tonight and is having rectal pain since.

## 2018-09-27 NOTE — ED Notes (Signed)
Patient left at this time with all belongings. 

## 2018-09-27 NOTE — ED Provider Notes (Signed)
[facility] Provider Note   CSN: [id number] Arrival date & time: 09/27/18  0440     History   Chief Complaint Chief Complaint  Patient presents with  . Rectal Pain    HPI Edward Melton is a 27 y.o. male.  HPI  27 year old male comes in with chief complaint of rectal pain. Patient has history of appendicitis.  He reports that he has been constipated for the last couple of days.  Yesterday he decided to try and strain on multiple locations to have a BM.  At nighttime he decided to take laxatives and attempted to have a BM again and strain multiple times.  Eventually he started having bleeding per rectum and started having severe pain, therefore he decided to come to the ER.   History reviewed. No pertinent past medical history.  Patient Active Problem List   Diagnosis Date Noted  . Acute appendicitis, uncomplicated 07/25/2017  . Acute appendicitis 07/25/2017    Past Surgical History:  Procedure Laterality Date  . APPENDECTOMY    . LAPAROSCOPIC APPENDECTOMY N/A 07/25/2017   Procedure: APPENDECTOMY LAPAROSCOPIC;  Surgeon: Claud KelpIngram, Haywood, MD;  [facility];  Service: General;  Laterality: N/A;        Home Medications    Prior to Admission medications   Medication Sig Start Date End Date Taking? Authorizing Provider  Bisacodyl (LAXATIVE PO) Take 1 tablet by mouth daily as needed (constipation).   Yes [provider]  HYDROcodone-acetaminophen (NORCO/VICODIN) 5-325 MG tablet Take 1-2 tablets by mouth every 4 (four) hours as needed for moderate pain. Patient not taking: Reported on 09/27/2018 07/26/17   Claud KelpIngram, Haywood, MD    Family History No family history on file.  Social History Social History   Tobacco Use  . Smoking status: Former Smoker    Packs/day: 0.25    Types: Cigarettes  . Smokeless tobacco: Never Used  Substance Use Topics  . Alcohol use: Never    Frequency: Never  . Drug use: Never      Allergies   Patient has no known allergies.   Review of Systems Review of Systems  Constitutional: Positive for activity change.  Gastrointestinal: Positive for abdominal pain and constipation.     Physical Exam Updated Vital Signs BP (!) 151/110   Pulse (!) 103   Ht 6\' 2"  (1.88 m)   Wt 124.7 kg   SpO2 98%   BMI 35.31 kg/m   Physical Exam  Constitutional: He is oriented to person, place, and time. He appears well-developed.  HENT:  Head: Atraumatic.  Neck: Neck supple.  Cardiovascular: Normal rate.  Pulmonary/Chest: Effort normal.  Abdominal: Soft. There is tenderness.  Lower quadrant tenderness  Genitourinary:  Genitourinary Comments: Digital rectal exam revealed no significant stool in the rectal vault.  No gross blood appreciated. We also did not appreciate any hernia or anal fissures  Neurological: He is alert and oriented to person, place, and time.  Skin: Skin is warm.  Nursing note and vitals reviewed.    ED Treatments / Results  Labs (all labs ordered are listed, but only abnormal results are displayed) Labs Reviewed  BASIC METABOLIC PANEL - Abnormal; Notable for the following components:      Result Value   Glucose, Bld 111 (*)    All other components within normal limits  CBC WITH DIFFERENTIAL/PLATELET    EKG None  Radiology Dg Abd Acute W/chest  Result Date: 09/27/2018 CLINICAL DATA:  Abdominal pain. Patient reports constipation, now  with rectal pain after taking a laxative. EXAM: DG ABDOMEN ACUTE W/ 1V CHEST COMPARISON:  CT 07/25/2017 FINDINGS: The cardiomediastinal contours are normal. Low lung volumes with minimal left basilar atelectasis. There is no free intra-abdominal air. No dilated bowel loops to suggest obstruction. Liquid and solid stool in the proximal colon with air-fluid levels. Air-filled transverse and proximal descending colon. Formed stool in the rectosigmoid colon, with rectal distention of 7.3 cm. No radiopaque calculi.  No acute osseous abnormalities are seen. IMPRESSION: 1. Low lung volumes with minimal left basilar atelectasis. 2. No bowel obstruction or free air. Moderate stool in the proximal and distal colon suggesting constipation, rectal distention suggests fecal impaction. Electronically Signed   By: Narda Rutherford M.D.   On: 09/27/2018 06:02    Procedures Procedures (including critical care time)  Medications Ordered in ED Medications  milk and molasses enema (has no administration in time range)     Initial Impression / Assessment and Plan / ED Course  I have reviewed the triage vital signs and the nursing notes.  Pertinent labs & imaging results that were available during my care of the patient were reviewed by me and considered in my medical decision making (see chart for details).     27 year old male comes in with chief complaint of rectal pain. He has history of appendectomy and has not had a BM for 2 days.  Differential diagnosis includes small bowel obstruction versus severe constipation. Acute abdominal series ordered and it shows moderate stool burden over the distal colon.  Digital rectal exam did not reveal any significant stool in the rectal vault, however it appears based on x-ray the patient has colonic fecal impaction.  Since manual disimpaction is unlikely to yield anything we will proceed with an enema. Patient has been made aware of the diagnosis.  Final Clinical Impressions(s) / ED Diagnoses   Final diagnoses:  Fecal impaction of colon [facility])    ED Discharge Orders    None       Derwood Kaplan, MD 09/27/18 [phone]

## 2020-03-13 IMAGING — DX DG ABDOMEN ACUTE W/ 1V CHEST
4 series · 4 of 4 positions shown · non-contrast
Comparison: CT 07/25/2017

CLINICAL DATA: Abdominal pain. Patient reports constipation, now
with rectal pain after taking a laxative.

EXAM:
DG ABDOMEN ACUTE W/ 1V CHEST

[chest pa]
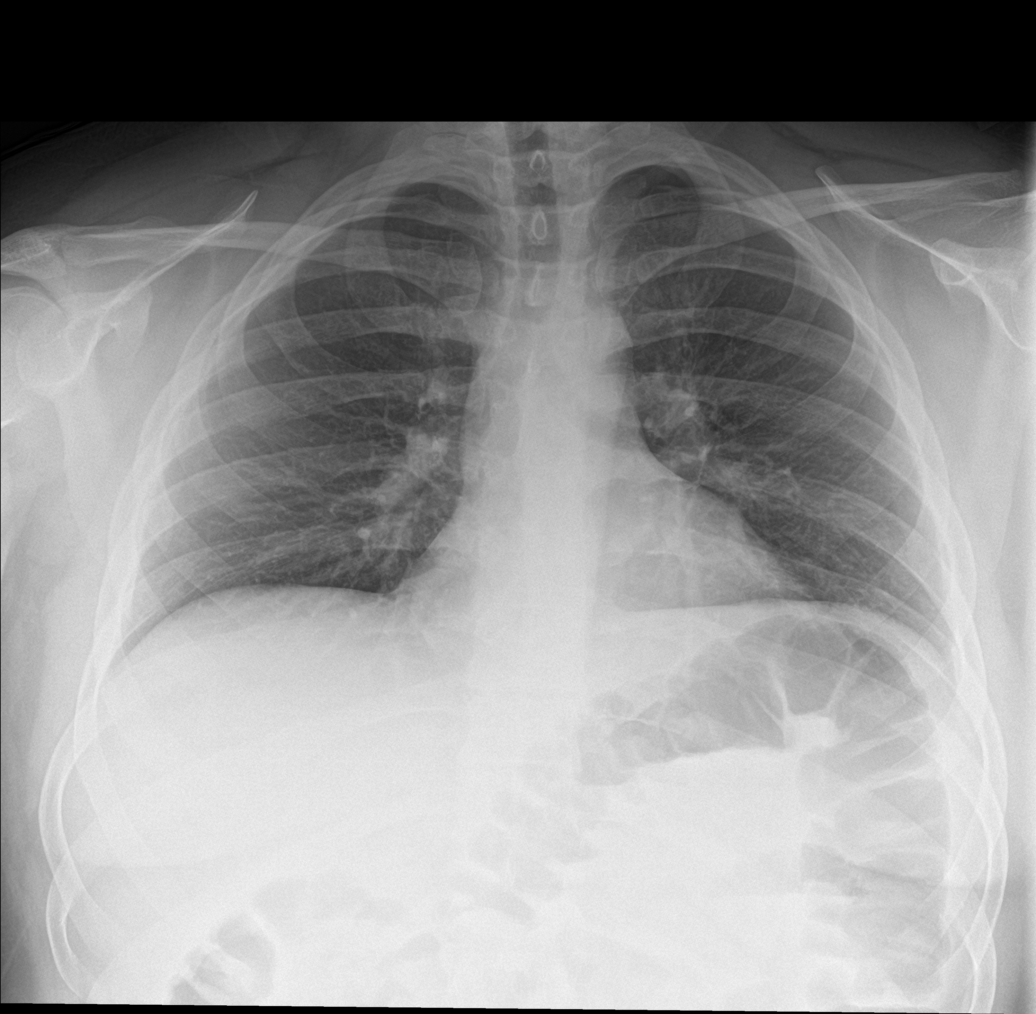

[abdomen erect]
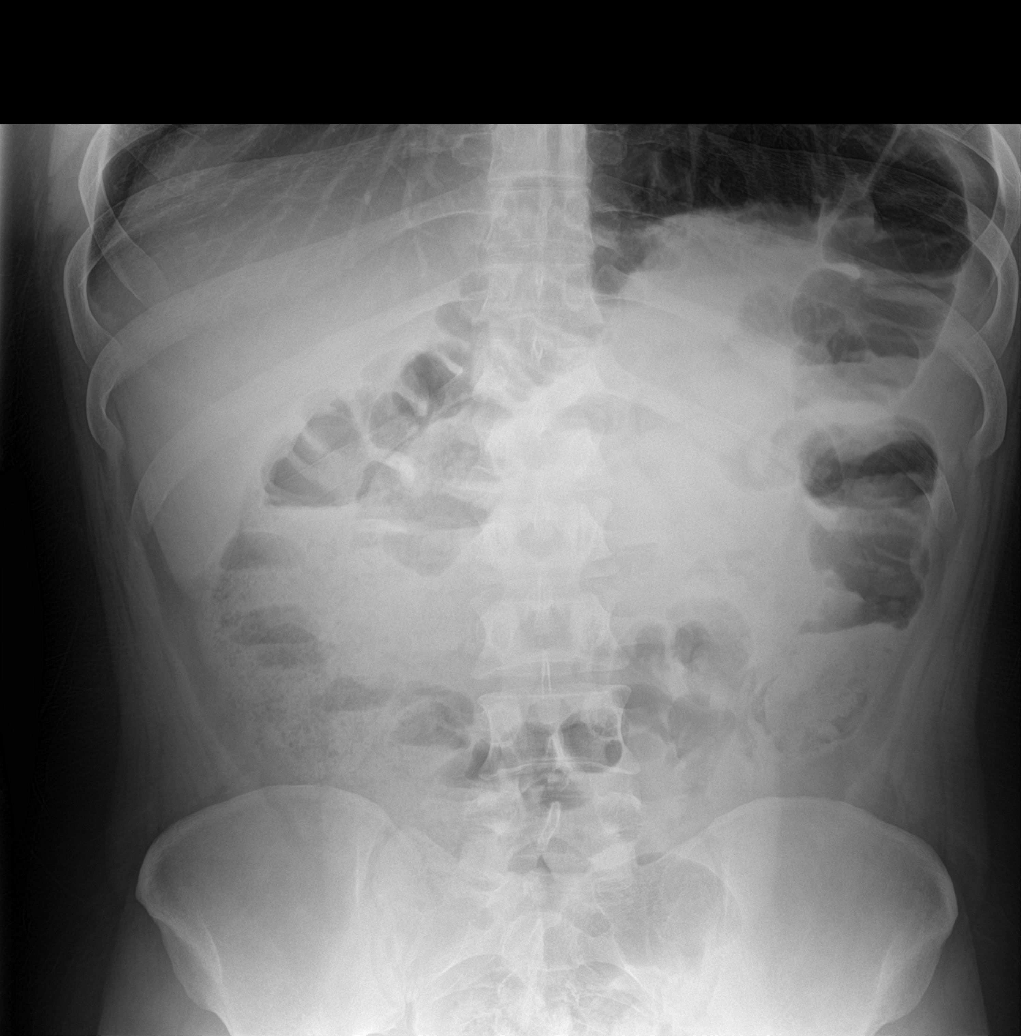

[abdomen supine (1 of 2)]
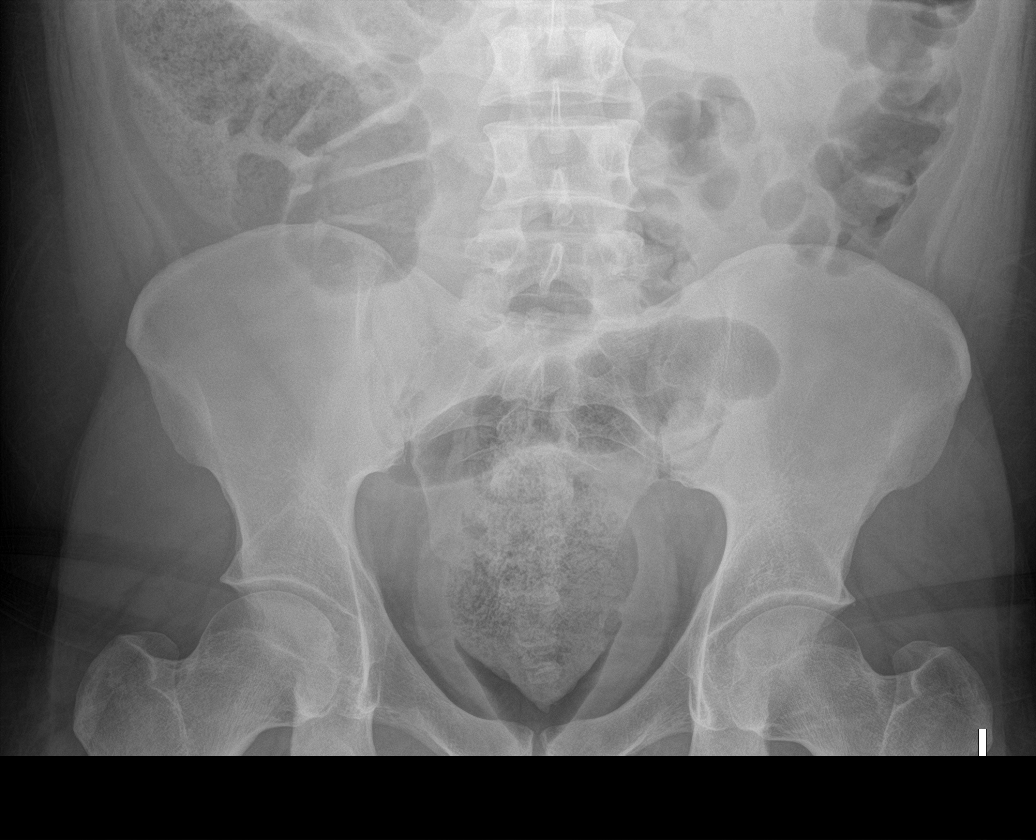

[abdomen supine (2 of 2)]
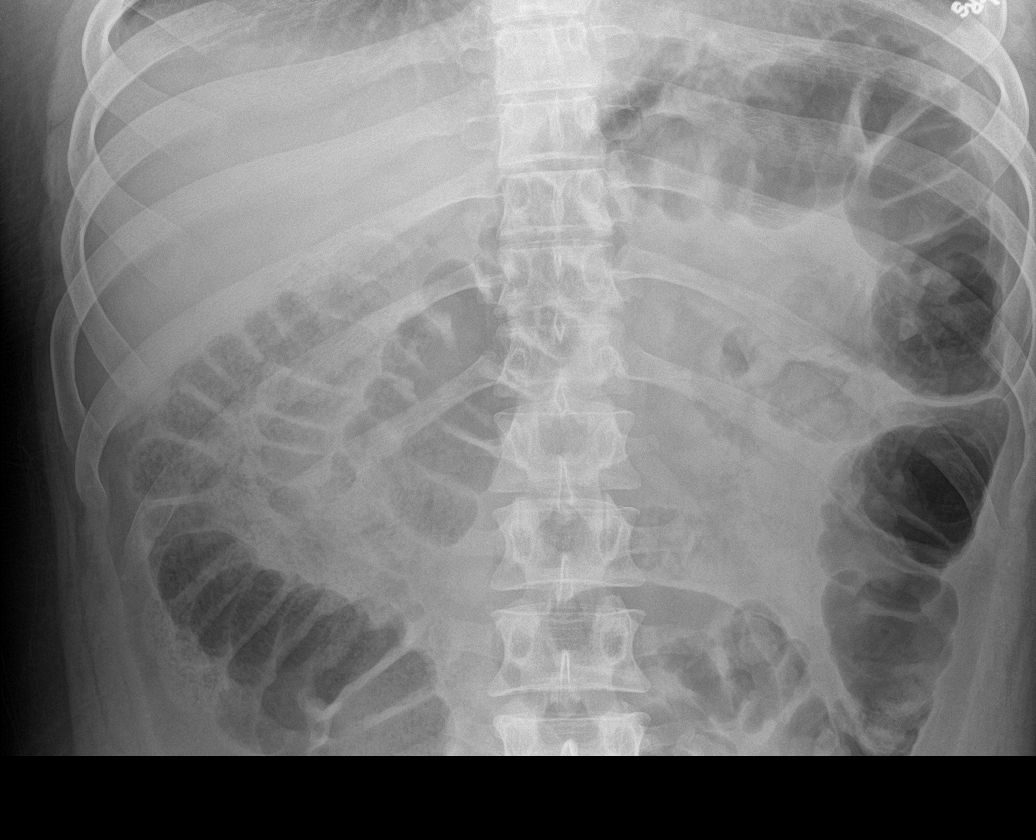

[4 of 4 positions shown; findings below may reference images not displayed]

FINDINGS: The cardiomediastinal contours are normal. Low lung volumes with
minimal left basilar atelectasis.. There is no free intra-abdominal
air. No dilated bowel loops to suggest obstruction. Liquid and solid
stool in the proximal colon with air-fluid levels. Air-filled
transverse and proximal descending colon. Formed stool in the
rectosigmoid colon, with rectal distention of 7.3 cm. No radiopaque
calculi. No acute osseous abnormalities are seen.
IMPRESSION: 1. Low lung volumes with minimal left basilar atelectasis.
2. No bowel obstruction or free air. Moderate stool in the proximal
and distal colon suggesting constipation, rectal distention suggests
fecal impaction.
